# Patient Record
Sex: Male | Born: 1990 | Race: Black or African American | Hispanic: No | Marital: Single | Smoking: Current every day smoker
Health system: Southern US, Community
[De-identification: ages and names within clinical notes are randomized; demographics above are authoritative.]

## PROBLEM LIST (undated history)

## (undated) HISTORY — PX: APPENDECTOMY: SHX54

---
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
## 2008-12-10 ENCOUNTER — Observation Stay (HOSPITAL_COMMUNITY): Admission: EM | Admit: 2008-12-10 | Discharge: 2008-12-11 | Payer: Self-pay | Admitting: Emergency Medicine

## 2008-12-10 ENCOUNTER — Encounter (INDEPENDENT_AMBULATORY_CARE_PROVIDER_SITE_OTHER): Payer: Self-pay | Admitting: Surgery

## 2010-01-07 IMAGING — CT CT PELVIS W/ CM
2 of 4 series · 17 of 46 positions shown, 19 images · IV contrast (water- bmi proto & 100 ML OMNI 300)
Comparison: None available.

CT ABDOMEN

CLINICAL DATA: Mid epigastric pain.  Abdominal pain.

CT ABDOMEN AND PELVIS WITH CONTRAST
TECHNIQUE: Multidetector CT imaging of the abdomen and pelvis was
performed using the standard protocol following bolus
administration of intravenous contrast.
Contrast: 100 ml Zmnipaque-Z55.

[Series 2: routine abdomen · axial · 0.98mm/px · z∈[-502,-82]mm · 14 of 94 slices shown, 16 images]
[im 5/94  soft-tissue]
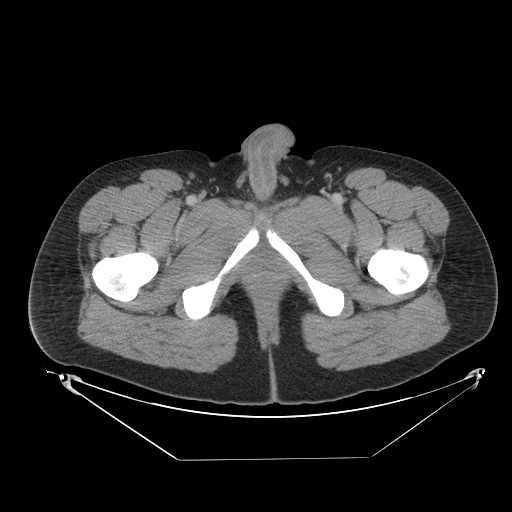
[im 5/94  bone]
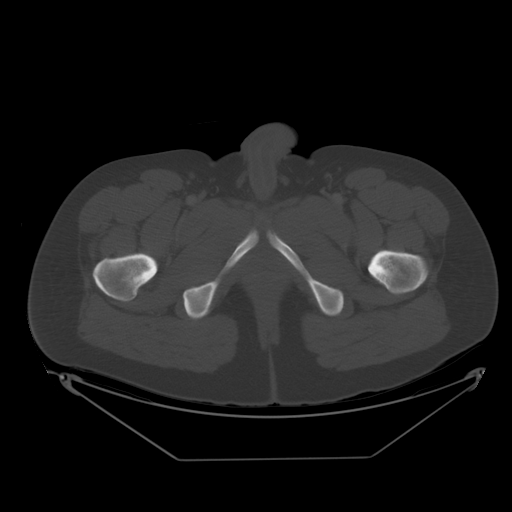
[im 13/94  soft-tissue]
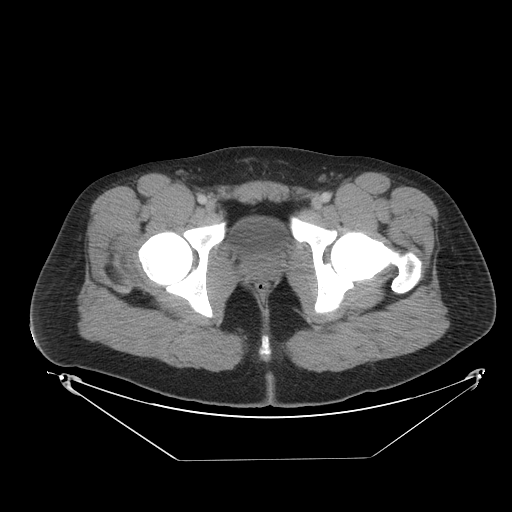
[im 17/94  soft-tissue]
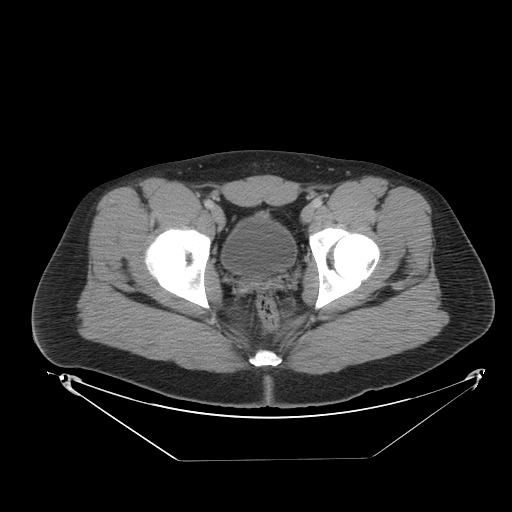
[im 25/94  soft-tissue]
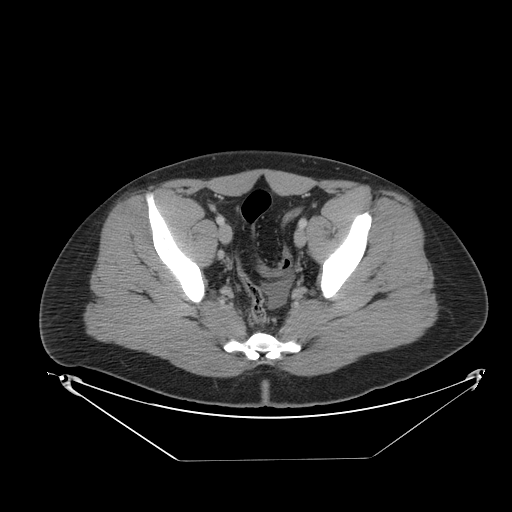
[im 33/94  soft-tissue]
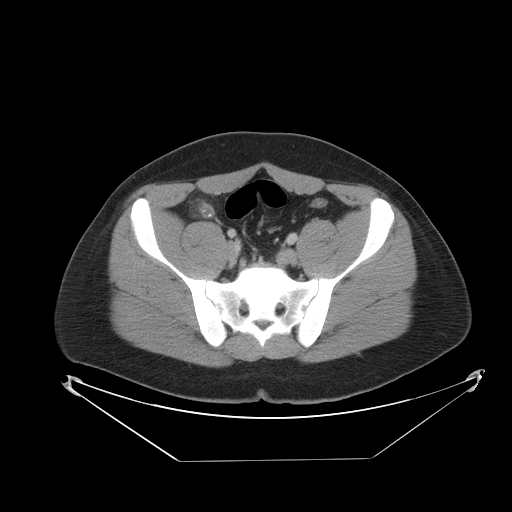
[im 37/94  soft-tissue]
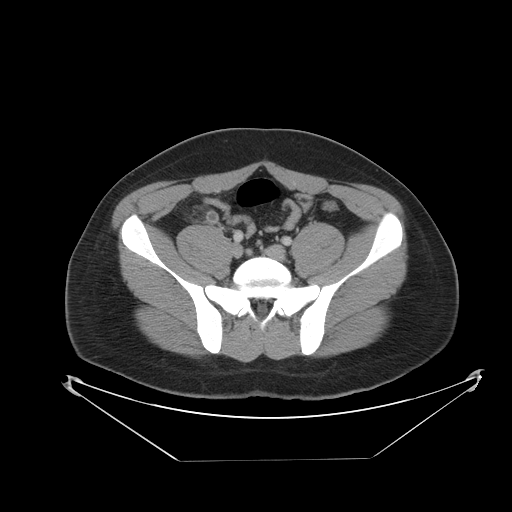
[im 45/94  soft-tissue]
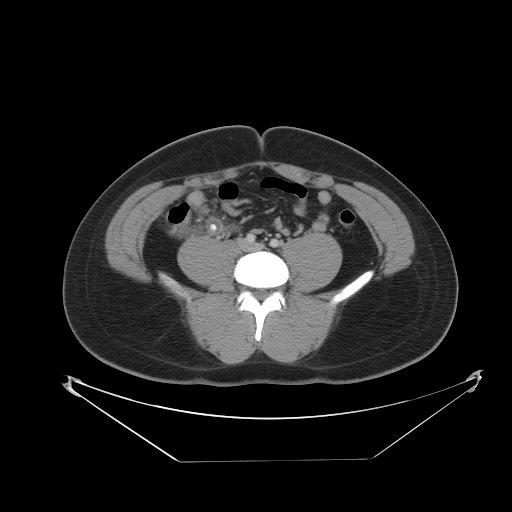
[im 49/94  soft-tissue]
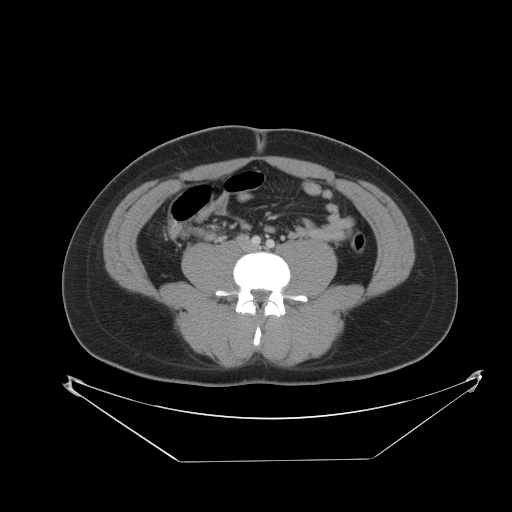
[im 57/94  soft-tissue]
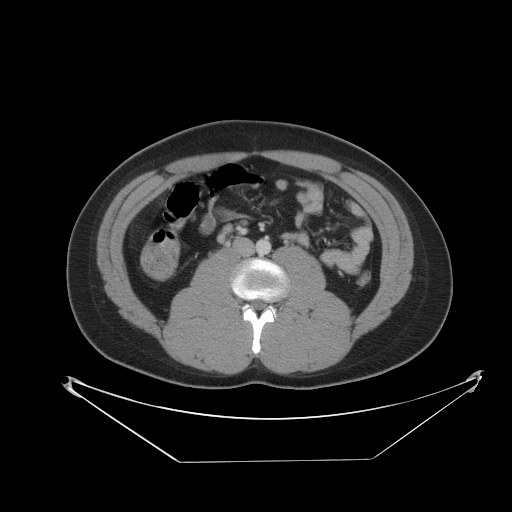
[im 57/94  bone]
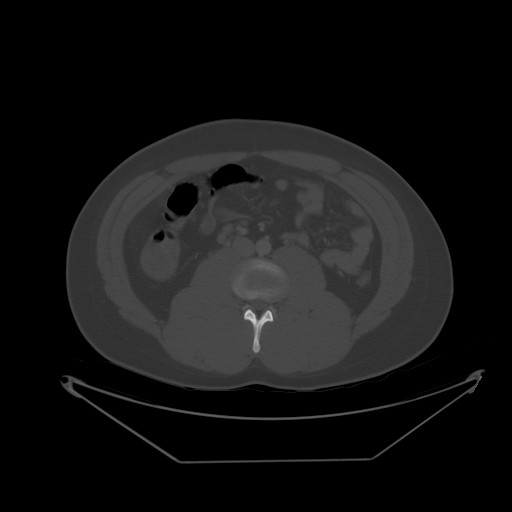
[im 61/94  soft-tissue]
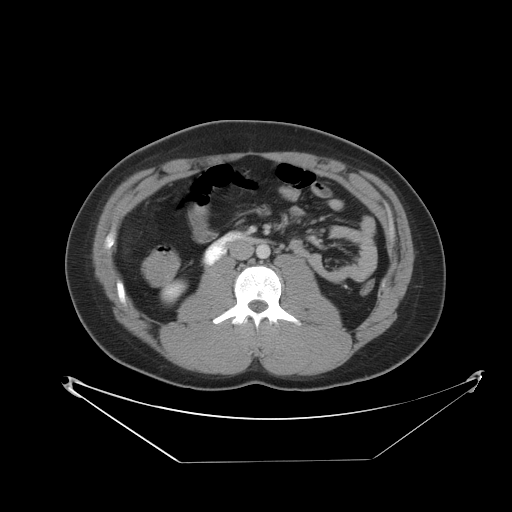
[im 69/94  soft-tissue]
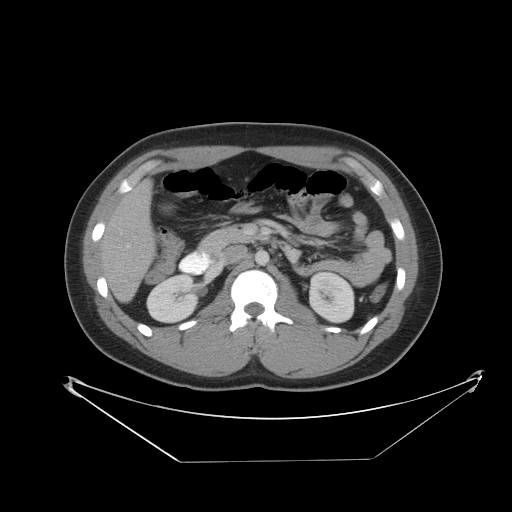
[im 77/94  soft-tissue]
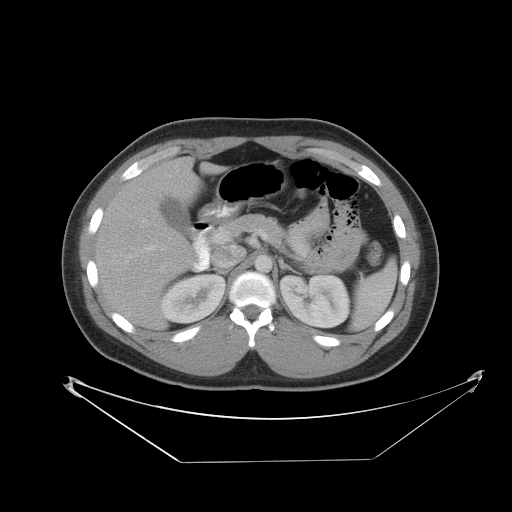
[im 81/94  soft-tissue]
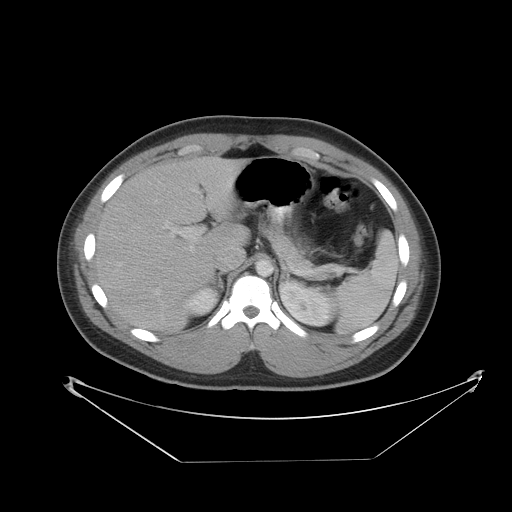
[im 89/94  soft-tissue]
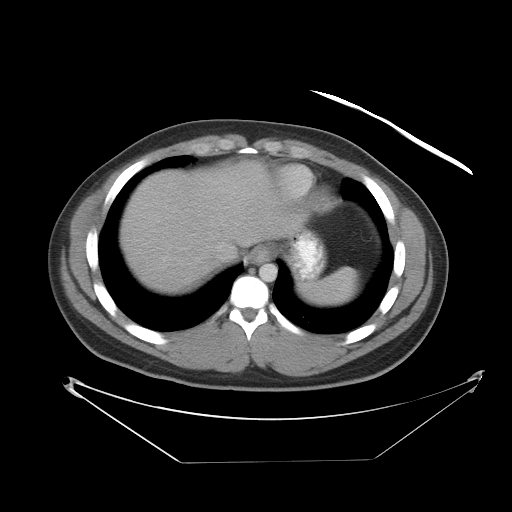

[Series 401: coronal · coronal · 0.90mm/px · 3 of 81 slices shown]
[im 27/81  soft-tissue]
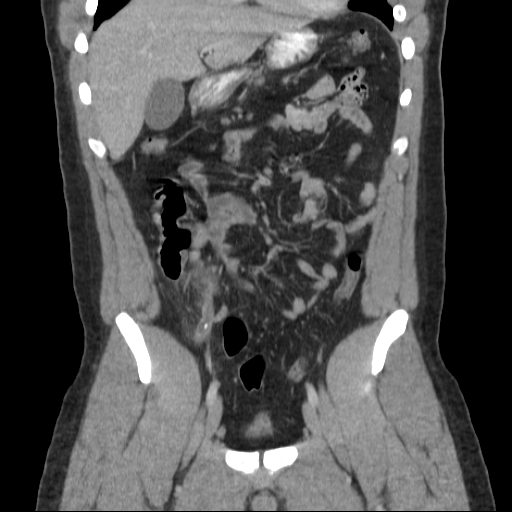
[im 36/81  soft-tissue]
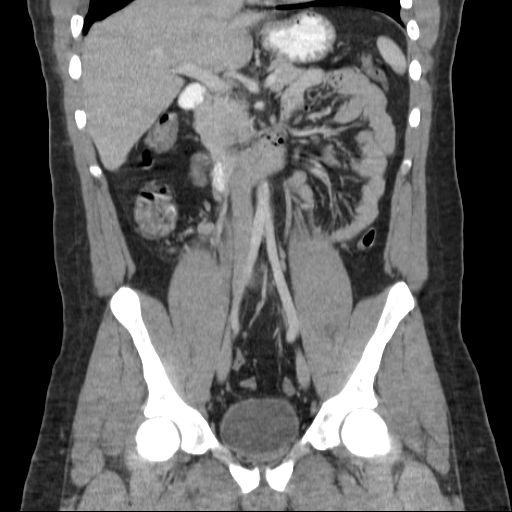
[im 45/81  soft-tissue]
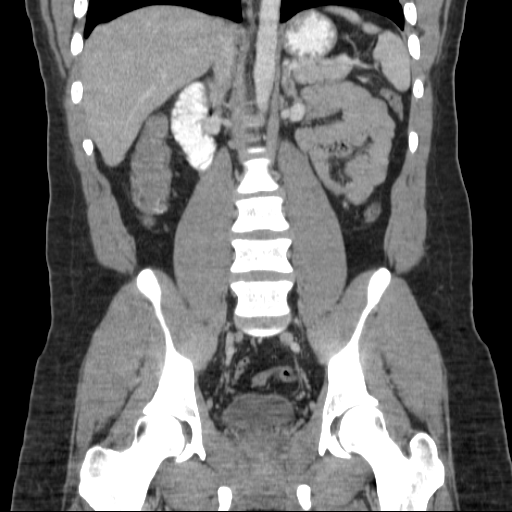

[17 of 46 positions shown; findings below may reference images not displayed]

FINDINGS: The lung bases are clear.  There is no pleural or
pericardial effusion.

The liver, gallbladder, biliary tree, adrenal glands, spleen,
kidneys and pancreas all appear normal.  No abdominal
lymphadenopathy or fluid.  There are no focal bony abnormality.
Stomach and small bowel appear normal.
IMPRESSION: Negative abdomen CT scan.

CT PELVIS
FINDINGS: There is dilatation of the appendix with periappendiceal
inflammatory change.  Three appendicoliths are identified with two
small calcifications seen in the distal appendix and a larger
calcification in the more proximal appendix measuring 0.8 cm in
diameter.  There is periappendiceal fluid is worrisome for
perforation without complete rupture. No abscess is identified.
There is a small amount of free pelvic fluid.  The colon appears
normal.  No lymphadenopathy.  Urinary bladder and seminal vesicles
appear normal.
IMPRESSION: Study is positive for acute appendicitis.  Periappendiceal fluid is
noted and there may be a tiny perforation of the appendix but no
evidence of full rupture is seen.

## 2010-05-25 ENCOUNTER — Emergency Department (HOSPITAL_COMMUNITY): Admission: EM | Admit: 2010-05-25 | Discharge: 2010-05-25 | Payer: Self-pay | Admitting: Emergency Medicine

## 2010-11-24 LAB — COMPREHENSIVE METABOLIC PANEL
ALT: 26 U/L (ref 0–53)
AST: 19 U/L (ref 0–37)
Albumin: 3.9 g/dL (ref 3.5–5.2)
Alkaline Phosphatase: 62 U/L (ref 39–117)
BUN: 17 mg/dL (ref 6–23)
Calcium: 9.6 mg/dL (ref 8.4–10.5)
GFR calc non Af Amer: >60 mL/min (ref >60)
Sodium: 137 mEq/L (ref 135–145)
Total Bilirubin: 0.7 mg/dL (ref 0.3–1.2)
Total Protein: 6.5 g/dL (ref 6.0–8.3)

## 2010-11-24 LAB — DIFFERENTIAL
Eosinophils Absolute: 0.7 10*3/uL (ref 0.0–0.7)
Eosinophils Relative: 5 % (ref 0–5)
Lymphocytes Relative: 11 % — ABNORMAL LOW (ref 12–46)
Lymphs Abs: 1.7 10*3/uL (ref 0.7–4.0)
Neutro Abs: 12 10*3/uL — ABNORMAL HIGH (ref 1.7–7.7)

## 2010-11-24 LAB — URINALYSIS, ROUTINE W REFLEX MICROSCOPIC
Bilirubin Urine: NEGATIVE
Hgb urine dipstick: NEGATIVE
Nitrite: NEGATIVE
Protein, ur: NEGATIVE mg/dL

## 2010-11-24 LAB — CBC
Hemoglobin: 15.2 g/dL (ref 13.0–17.0)
MCHC: 34.1 g/dL (ref 30.0–36.0)
RBC: 5.4 MIL/uL (ref 4.22–5.81)
RDW: 12.5 % (ref 11.5–15.5)
WBC: 15.4 10*3/uL — ABNORMAL HIGH (ref 4.0–10.5)

## 2010-11-24 LAB — LIPASE, BLOOD: Lipase: 19 U/L (ref 11–59)

## 2010-12-28 NOTE — Op Note (Signed)
NAME:  Tyrone Johns, Tyrone Johns NO.:  [id number]   MEDICAL RECORD NO.:  [id number]          PATIENT TYPE:  INP   LOCATION:  2550                         FACILITY:  [facility]   PHYSICIAN:  Wilmon Arms. Corliss Skains, M.D. DATE OF BIRTH:  1990/10/09   DATE OF PROCEDURE:  12/10/2008  DATE OF DISCHARGE:                               OPERATIVE REPORT   PREOPERATIVE DIAGNOSIS:  Acute appendicitis.   POSTOPERATIVE DIAGNOSIS:  Acute appendicitis.   PROCEDURE PERFORMED:  Laparoscopic appendectomy.   SURGEON:  Wilmon Arms. Corliss Skains, MD   ANESTHESIA:  General.   INDICATIONS:  The patient is a 20 year old male who presents with a 24-  hour history of worsening right lower quadrant pain.  He denies any  nausea, vomiting, or diarrhea.  He has had a poor appetite today.  He  presents to the emergency department for evaluation where a CT scan  showed an inflamed and enlarged appendix with some small amount of free  fluid but no sign of abscess.  His white count was elevated at 15.4.  We  are consulted for appendectomy.   DESCRIPTION OF PROCEDURE:  The patient was brought to the operating and  placed in supine position on the operating room table.  After an  adequate level of general anesthesia was obtained, a Foley catheter was  placed under sterile technique.  The patient's abdomen was prepped with  Betadine and draped in sterile fashion.  A time-out was taken to ensure  the proper patient and proper procedure.  We infiltrated the area above  the umbilicus with 0.25% Marcaine with epinephrine.  A transverse  incision was made and dissection was carried down through the  subcutaneous tissues to the fascia.  The fascia was opened vertically.  We entered the peritoneal cavity bluntly.  A stay suture of 0-Vicryl was  placed in the fascial opening.  We inserted the Hasson cannula and  secured it with a stay suture.  Pneumoperitoneum was obtained by  insufflating CO2 maintaining a maximal pressure of 15  mmHg.  Laparoscope  was inserted and the patient was positioned in Trendelenburg tilted to  his left.  A 5-mm port was placed in the right upper quadrant and  another 5-mm port in the left lower quadrant.  We moved the scope to the  right upper quadrant port site.  A very large inflamed appendix was  noted in the right lower quadrant.  We grasped this with a clamp and  elevated.  We used harmonic scalpel to lyse the adhesions around the  appendix.  The appendix turned out to be very long and was extremely  swollen and dilated.  There was no gross perforation or purulence.  We  continued tracing this back until we were able to identify the junction  of the appendix and the cecum.  The base of the appendix was soft and  normal in appearance.  Once we had completely taken down the  mesoappendix, the base of the appendix at the cecum was divided with  Endo-GIA stapler blue load.  The appendix was placed in an EndoCatch sac  and removed through the  umbilical port site.  The staple line was  carefully inspected and hemostasis was good.  We irrigated the right  lower quadrant and then removed our trocars as pneumoperitoneum was  released.  The purse-string suture was used to close the fascia.  A 4-0 Monocryl  was used to close the skin incisions.  Steri-Strips and clean dressings  were applied.  The patient was extubated and brought to recovery room in  stable condition.  The Foley catheter was removed.      Wilmon Arms. Tsuei, M.D.  Electronically Signed     MKT/MEDQ  D:  12/10/2008  T:  12/11/2008  Job:  [id number]

## 2014-03-06 ENCOUNTER — Emergency Department (HOSPITAL_COMMUNITY)
Admission: EM | Admit: 2014-03-06 | Discharge: 2014-03-06 | Disposition: A | Discharge status: Home / Self Care | Payer: BC Managed Care – PPO | Attending: Emergency Medicine | Admitting: Emergency Medicine

## 2014-03-06 ENCOUNTER — Encounter (HOSPITAL_COMMUNITY): Payer: Self-pay | Admitting: Emergency Medicine

## 2014-03-06 ENCOUNTER — Emergency Department (HOSPITAL_COMMUNITY): Payer: BC Managed Care – PPO

## 2014-03-06 DIAGNOSIS — Y92410 Unspecified street and highway as the place of occurrence of the external cause: Secondary | ICD-10-CM | POA: Insufficient documentation

## 2014-03-06 DIAGNOSIS — F17200 Nicotine dependence, unspecified, uncomplicated: Secondary | ICD-10-CM | POA: Insufficient documentation

## 2014-03-06 DIAGNOSIS — S43102A Unspecified dislocation of left acromioclavicular joint, initial encounter: Secondary | ICD-10-CM

## 2014-03-06 DIAGNOSIS — Y9389 Activity, other specified: Secondary | ICD-10-CM | POA: Insufficient documentation

## 2014-03-06 DIAGNOSIS — S43109A Unspecified dislocation of unspecified acromioclavicular joint, initial encounter: Secondary | ICD-10-CM | POA: Insufficient documentation

## 2014-03-06 DIAGNOSIS — S46909A Unspecified injury of unspecified muscle, fascia and tendon at shoulder and upper arm level, unspecified arm, initial encounter: Secondary | ICD-10-CM | POA: Insufficient documentation

## 2014-03-06 DIAGNOSIS — S4980XA Other specified injuries of shoulder and upper arm, unspecified arm, initial encounter: Secondary | ICD-10-CM | POA: Insufficient documentation

## 2014-03-06 MED ORDER — IBUPROFEN 400 MG PO TABS
800.0000 mg | ORAL_TABLET | Freq: Once | ORAL | Status: AC
  Administered 2014-03-06: 800 mg via ORAL
  Filled 2014-03-06: qty 2

## 2014-03-06 MED ORDER — HYDROCODONE-ACETAMINOPHEN 5-325 MG PO TABS: 1.0000 | ORAL_TABLET | ORAL | Status: AC | PRN

## 2014-03-06 MED ORDER — OXYCODONE-ACETAMINOPHEN 5-325 MG PO TABS
1.0000 | ORAL_TABLET | Freq: Once | ORAL | Status: AC
  Administered 2014-03-06: 1 via ORAL
  Filled 2014-03-06: qty 1
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
## 2014-03-06 NOTE — Discharge Planning (Signed)
[facility] Community Liaison ° °Spoke to the patient regarding primary care resources and establishing care with a provider. Resource guide and my contact information provided for any future questions or concerns. No other community liaison needs identified at this time. °

## 2014-03-06 NOTE — Discharge Instructions (Signed)
Take Vicodin for severe pain only. No driving or operating heavy machinery while taking vicodin. This medication may cause drowsiness. Apply ice intermittently throughout the day. Wear the sling unless you are at home. No lifting greater than 10 pounds until followup with orthopedics.  Acromioclavicular Injuries The AC (acromioclavicular) joint is the joint in the shoulder where the collarbone (clavicle) meets the shoulder blade (scapula). The part of the shoulder blade connected to the collarbone is called the acromion. Common problems with and treatments for the [facility] joint are detailed below. ARTHRITIS Arthritis occurs when the joint has been injured and the smooth padding between the joints (cartilage) is lost. This is the wear and tear seen in most joints of the body if they have been overused. This causes the joint to produce pain and swelling which is worse with activity.  AC JOINT SEPARATION AC joint separation means that the ligaments connecting the acromion of the shoulder blade and collarbone have been damaged, and the two bones no longer line up. AC separations can be anywhere from mild to severe, and are "graded" depending upon which ligaments are torn and how badly they are torn.  Grade I Injury: the least damage is done, and the [facility] joint still lines up.  Grade II Injury: damage to the ligaments which reinforce the [facility] joint. In a Grade II injury, these ligaments are stretched but not entirely torn. When stressed, the [facility] joint becomes painful and unstable.  Grade III Injury: AC and secondary ligaments are completely torn, and the collarbone is no longer attached to the shoulder blade. This results in deformity; a prominence of the end of the clavicle. AC JOINT FRACTURE AC joint fracture means that there has been a break in the bones of the [facility] joint, usually the end of the clavicle. TREATMENT TREATMENT OF AC ARTHRITIS  There is currently no way to replace the cartilage damaged by  arthritis. The best way to improve the condition is to decrease the activities which aggravate the problem. Application of ice to the joint helps decrease pain and soreness (inflammation). The use of non-steroidal anti-inflammatory medication is helpful.  If less conservative measures do not work, then cortisone shots (injections) may be used. These are anti-inflammatories; they decrease the soreness in the joint and swelling.  If non-surgical measures fail, surgery may be recommended. The procedure is generally removal of a portion of the end of the clavicle. This is the part of the collarbone closest to your acromion which is stabilized with ligaments to the acromion of the shoulder blade. This surgery may be performed using a tube-like instrument with a light (arthroscope) for looking into a joint. It may also be performed as an open surgery through a small incision by the surgeon. Most patients will have good range of motion within 6 weeks and may return to all activity including sports by 8-12 weeks, barring complications. TREATMENT OF AN AC SEPARATION  The initial treatment is to decrease pain. This is best accomplished by immobilizing the arm in a sling and placing an ice pack to the shoulder for 20 to 30 minutes every 2 hours as needed. As the pain starts to subside, it is important to begin moving the fingers, wrist, elbow and eventually the shoulder in order to prevent a stiff or "frozen" shoulder. Instruction on when and how much to move the shoulder will be provided by your caregiver. The length of time needed to regain full motion and function depends on the amount or grade of  the injury. Recovery from a Grade I AC separation usually takes 10 to 14 days, whereas a Grade III may take 6 to 8 weeks.  Grade I and II separations usually do not require surgery. Even Grade III injuries usually allow return to full activity with few restrictions. Treatment is also based on the activity demands of the  injured shoulder. For example, a high level quarterback with an injured throwing arm will receive more aggressive treatment than someone with a desk job who rarely uses his/her arm for strenuous activities. In some cases, a painful lump may persist which could require a later surgery. Surgery can be very successful, but the benefits must be weighed against the potential risks. TREATMENT OF AN AC JOINT FRACTURE Fracture treatment depends on the type of fracture. Sometimes a splint or sling may be all that is required. Other times surgery may be required for repair. This is more frequently the case when the ligaments supporting the clavicle are completely torn. Your caregiver will help you with these decisions and together you can decide what will be the best treatment. HOME CARE INSTRUCTIONS   Apply ice to the injury for 15-20 minutes each hour while awake for 2 days. Put the ice in a plastic bag and place a towel between the bag of ice and skin.  If a sling has been applied, wear it constantly for as long as directed by your caregiver, even at night. The sling or splint can be removed for bathing or showering or as directed. Be sure to keep the shoulder in the same place as when the sling is on. Do not lift the arm.  If a figure-of-eight splint has been applied it should be tightened gently by another person every day. Tighten it enough to keep the shoulders held back. Allow enough room to place the index finger between the body and strap. Loosen the splint immediately if there is numbness or tingling in the hands.  Take over-the-counter or prescription medicines for pain, discomfort or fever as directed by your caregiver.  If you or your child has received a follow up appointment, it is very important to keep that appointment in order to avoid long term complications, chronic pain or disability. SEEK MEDICAL CARE IF:   The pain is not relieved with medications.  There is increased swelling or  discoloration that continues to get worse rather than better.  You or your child has been unable to follow up as instructed.  There is progressive numbness and tingling in the arm, forearm or hand. SEEK IMMEDIATE MEDICAL CARE IF:   The arm is numb, cold or pale.  There is increasing pain in the hand, forearm or fingers. MAKE SURE YOU:   Understand these instructions.  Will watch your condition.  Will get help right away if you are not doing well or get worse. Document Released: 05/11/2005 Document Revised: 10/24/2011 Document Reviewed: 11/03/2008 [facility] Patient Information 2015 [place], [place]. This information is not intended to replace advice given to you by your health care provider. Make sure you discuss any questions you have with your health care provider.  Acromioclavicular Separation with Rehab The acromioclavicular joint is the joint between the roof of the shoulder (acromion) and the collarbone (clavicle). It is vulnerable to injury. An acromioclavicular [facility]) separation is a partial or complete tear (sprain), injury, or redness and soreness (inflammation) of the ligaments that cross the acromioclavicular joint and hold it in place. There are two ligaments in this area that are  vulnerable to injury, the acromioclavicular ligament and the coracoclavicular ligament. SYMPTOMS   Tenderness and swelling, or a bump on top of the shoulder (at the [facility] joint).  Bruising (contusion) in the area within 48 hours of injury.  Loss of strength or pain when reaching over the head or across the body. CAUSES  AC separation is caused by direct trauma to the joint (falling on your shoulder) or indirect trauma (falling on an outstretched arm). RISK INCREASES WITH:  Sports that require contact or collision, throwing sports (i.e. racquetball, squash).  Poor strength and flexibility.  Previous shoulder sprain or dislocation.  Poorly fitted or padded protective equipment. PREVENTION    Warm-up and stretch properly before activity.  Maintain physical fitness:  Shoulder strength.  Shoulder flexibility.  Cardiovascular fitness.  Wear properly fitted and padded protective equipment.  Learn and use proper technique when playing sports. Have a coach correct improper technique, including falling and landing.  Apply taping, protective strapping or padding, or an adhesive bandage as recommended before practice or competition. PROGNOSIS   If treated properly, the symptoms of AC separation can be expected to go away.  If treated improperly, permanent disability may occur unless surgery is performed.  Healing time varies with type of sport and position, arm injured (dominant versus non-dominant) and severity of sprain. RELATED COMPLICATIONS  Weakness and fatigue of the arm or shoulder are possible but uncommon.  Pain and inflammation of the [facility] joint may continue.  Prolonged healing time may be necessary if usual activities are resumed too early. This causes a susceptibility to recurrent injury.  Prolonged disability may occur.  The shoulder may remain unstable or arthritic following repeated injury. TREATMENT  Treatment initially involves ice and medication to help reduce pain and inflammation. It may also be necessary to modify your activities in order to prevent further injury. Both non-surgical and surgical interventions exist to treat AC separation. Non-surgical intervention is usually recommended and involves wearing a sling to immobilize the joint for a period of time to allow for healing. Surgical intervention is usually only considered for severe sprains of the ligament or for individuals who do not improve after 2 to 6 months of non-surgical treatment. Surgical interventions require 4 to 6 months before a return to sports is possible. MEDICATION  If pain medication is necessary, nonsteroidal anti-inflammatory medications, such as aspirin and ibuprofen, or  other minor pain relievers, such as acetaminophen, are often recommended.  Do not take pain medication for 7 days before surgery.  Prescription pain relievers may be given by your caregiver. Use only as directed and only as much as you need.  Ointments applied to the skin may be helpful.  Corticosteroid injections may be given to reduce inflammation. HEAT AND COLD  Cold treatment (icing) relieves pain and reduces inflammation. Cold treatment should be applied for 10 to 15 minutes every 2 to 3 hours for inflammation and pain and immediately after any activity that aggravates your symptoms. Use ice packs or an ice massage.  Heat treatment may be used prior to performing the stretching and strengthening activities prescribed by your caregiver, physical therapist or athletic trainer. Use a heat pack or a warm soak. SEEK IMMEDIATE MEDICAL CARE IF:   Pain, swelling or bruising worsens despite treatment.  There is pain, numbness or coldness in the arm.  Discoloration appears in the fingernails.  New, unexplained symptoms develop. EXERCISES  RANGE OF MOTION (ROM) AND STRETCHING EXERCISES - Acromioclavicular Separation These exercises may help you when beginning  to rehabilitate your injury. Your symptoms may resolve with or without further involvement from your physician, physical therapist or athletic trainer. While completing these exercises, remember:  Restoring tissue flexibility helps normal motion to return to the joints. This allows healthier, less painful movement and activity.  An effective stretch should be held for at least 30 seconds.  A stretch should never be painful. You should only feel a gentle lengthening or release in the stretched tissue. ROM - Pendulum  Bend at the waist so that your right / left arm falls away from your body. Support yourself with your opposite hand on a solid surface, such as a table or a countertop.  Your right / left arm should be perpendicular to  the ground. If it is not perpendicular, you need to lean over farther. Relax the muscles in your right / left arm and shoulder as much as possible.  Gently sway your hips and trunk so they move your right / left arm without any use of your right / left shoulder muscles.  Progress your movements so that your right / left arm moves side to side, then forward and backward, and finally, both clockwise and counterclockwise.  Complete __________ repetitions in each direction. Many people use this exercise to relieve discomfort in their shoulder as well as to gain range of motion. Repeat __________ times. Complete this exercise __________ times per day. STRETCH - Flexion, Seated   Sit in a firm chair so that your right / left forearm can rest on a table or countertop. Your right / left elbow should rest below the height of your shoulder so that your shoulder feels supported and not tense or uncomfortable.  Keeping your right / left shoulder relaxed, lean forward at your waist, allowing your right / left hand to slide forward. Bend forward until you feel a moderate stretch in your shoulder, but before you feel an increase in your pain.  Hold __________ seconds. Slowly return to your starting position. Repeat __________ times. Complete this exercise __________ times per day. STRETCH - Flexion, Standing  Stand with good posture. With an underhand grip on your right / left and an overhand grip on the opposite hand, grasp a broomstick or cane so that your hands are a little more than shoulder-width apart.  Keeping your right / left elbow straight and shoulder muscles relaxed, push the stick with your opposite hand to raise your right / left arm in front of your body and then overhead. Raise your arm until you feel a stretch in your right / left shoulder, but before you have increased shoulder pain.  Try to avoid shrugging your right / left shoulder as your arm rises by keeping your shoulder blade tucked  down and toward your mid-back spine. Hold __________ seconds.  Slowly return to the starting position. Repeat __________ times. Complete this exercise __________ times per day. STRENGTHENING EXERCISES - Acromioclavicular Separation These exercises may help you when beginning to rehabilitate your injury. They may resolve your symptoms with or without further involvement from your physician, physical therapist or athletic trainer. While completing these exercises, remember:  Muscles can gain both the endurance and the strength needed for everyday activities through controlled exercises.  Complete these exercises as instructed by your physician, physical therapist or athletic trainer. Progress the resistance and repetitions only as guided.  You may experience muscle soreness or fatigue, but the pain or discomfort you are trying to eliminate should never worsen during these exercises. If this pain  does worsen, stop and make certain you are following the directions exactly. If the pain is still present after adjustments, discontinue the exercise until you can discuss the trouble with your clinician. STRENGTH - Shoulder Abductors, Isometric   With good posture, stand or sit about 4-6 inches from a wall with your right / left side facing the wall.  Bend your right / left elbow. Gently press your right / left elbow into the wall. Increase the pressure gradually until you are pressing as hard as you can without shrugging your shoulder or increasing any shoulder discomfort.  Hold __________ seconds.  Release the tension slowly. Relax your shoulder muscles completely before you start the next repetition. Repeat __________ times. Complete this exercise __________ times per day. STRENGTH - Internal Rotators, Isometric  Keep your right / left elbow at your side and bend it 90 degrees.  Step into a door frame so that the inside of your right / left wrist can press against the door frame without your upper  arm leaving your side.  Gently press your right / left wrist into the door frame as if you were trying to draw the palm of your hand to your abdomen. Gradually increase the tension until you are pressing as hard as you can without shrugging your shoulder or increasing any shoulder discomfort.  Hold __________ seconds.  Release the tension slowly. Relax your shoulder muscles completely before you the next repetition. Repeat __________ times. Complete this exercise __________ times per day.  STRENGTH - External Rotators, Isometric  Keep your right / left elbow at your side and bend it 90 degrees.  Step into a door frame so that the outside of your right / left wrist can press against the door frame without your upper arm leaving your side.  Gently press your right / left wrist into the door frame as if you were trying to swing the back of your hand away from your abdomen. Gradually increase the tension until you are pressing as hard as you can without shrugging your shoulder or increasing any shoulder discomfort.  Hold __________ seconds.  Release the tension slowly. Relax your shoulder muscles completely before you the next repetition. Repeat __________ times. Complete this exercise __________ times per day. STRENGTH - Internal Rotators  Secure a rubber exercise band/tubing to a fixed object so that it is at the same height as your right / left elbow when you are standing or sitting on a firm surface.  Stand or sit so that the secured exercise band/tubing is at your right / left side.  Bend your elbow 90 degrees. Place a folded towel or small pillow under your right / left arm so that your elbow is a few inches away from your side.  Keeping the tension on the exercise band/tubing, pull it across your body toward your abdomen. Be sure to keep your body steady so that the movement is only coming from your shoulder rotating.  Hold __________ seconds. Release the tension in a controlled  manner as you return to the starting position. Repeat __________ times. Complete this exercise __________ times per day. STRENGTH - External Rotators  Secure a rubber exercise band/tubing to a fixed object so that it is at the same height as your right / left elbow when you are standing or sitting on a firm surface.  Stand or sit so that the secured exercise band/tubing is at your side that is not injured.  Bend your elbow 90 degrees. Place a folded  towel or small pillow under your right / left arm so that your elbow is a few inches away from your side.  Keeping the tension on the exercise band/tubing, pull it away from your body, as if pivoting on your elbow. Be sure to keep your body steady so that the movement is only coming from your shoulder rotating.  Hold __________ seconds. Release the tension in a controlled manner as you return to the starting position. Repeat __________ times. Complete this exercise __________ times per day. Document Released: 08/01/2005 Document Revised: 10/24/2011 Document Reviewed: 11/13/2008 [facility] Patient Information 2015 [place], [place]. This information is not intended to replace advice given to you by your health care provider. Make sure you discuss any questions you have with your health care provider.
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
## 2014-03-07 NOTE — ED Provider Notes (Signed)
Medical screening examination/treatment/procedure(s) were performed by non-physician practitioner and as supervising physician I was immediately available for consultation/collaboration.     Geoffery Lyonsouglas Princeston Blizzard, MD 03/07/14 [phone]

## 2014-04-25 ENCOUNTER — Encounter (HOSPITAL_COMMUNITY): Payer: Self-pay | Admitting: Emergency Medicine

## 2014-04-25 ENCOUNTER — Emergency Department (INDEPENDENT_AMBULATORY_CARE_PROVIDER_SITE_OTHER)
Admission: EM | Admit: 2014-04-25 | Discharge: 2014-04-25 | Disposition: A | Discharge status: Home / Self Care | Payer: BC Managed Care – PPO | Source: Home / Self Care | Attending: Emergency Medicine | Admitting: Emergency Medicine

## 2014-04-25 DIAGNOSIS — K089 Disorder of teeth and supporting structures, unspecified: Secondary | ICD-10-CM

## 2014-04-25 DIAGNOSIS — K0889 Other specified disorders of teeth and supporting structures: Secondary | ICD-10-CM

## 2014-04-25 DIAGNOSIS — K05219 Aggressive periodontitis, localized, unspecified severity: Secondary | ICD-10-CM

## 2014-04-25 DIAGNOSIS — K0520 Aggressive periodontitis, unspecified: Secondary | ICD-10-CM

## 2014-04-25 MED ORDER — HYDROCODONE-ACETAMINOPHEN 5-325 MG PO TABS: 2.0000 | ORAL_TABLET | ORAL | Status: AC | PRN

## 2014-04-25 MED ORDER — IBUPROFEN 800 MG PO TABS: 800.0000 mg | ORAL_TABLET | Freq: Three times a day (TID) | ORAL | Status: AC

## 2014-04-25 MED ORDER — AMOXICILLIN 500 MG PO CAPS: 500.0000 mg | ORAL_CAPSULE | Freq: Two times a day (BID) | ORAL | Status: AC

## 2014-04-25 NOTE — ED Provider Notes (Signed)
Medical screening examination/treatment/procedure(s) were performed by non-physician practitioner and as supervising physician I was immediately available for consultation/collaboration.  Leslee Home, M.D.  Reuben Likes, MD 04/25/14 2135

## 2014-04-25 NOTE — ED Provider Notes (Signed)
CSN: [id number]     Arrival date & time 04/25/14  1848 History   First MD Initiated Contact with Patient 04/25/14 1903     Chief Complaint  Patient presents with  . Dental Pain   (Consider location/radiation/quality/duration/timing/severity/associated sxs/prior Treatment) HPI Comments: Patient presents with a painful lower left sided tooth. He reports a "hole" in the tooth, with pain that began 2 days ago. He has a dentist appt on Tuesday but the pain is worsening. No fever or chills are noted. No N, V   Patient is a 23 y.o. male presenting with tooth pain. The history is provided by the patient.  Dental Pain   History reviewed. No pertinent past medical history. Past Surgical History  Procedure Laterality Date  . Appendectomy     No family history on file. History  Substance Use Topics  . Smoking status: Current Every Day Smoker  . Smokeless tobacco: Not on file  . Alcohol Use: Yes    Review of Systems  All other systems reviewed and are negative.   Allergies  Review of patient's allergies indicates no known allergies.  Home Medications   Prior to Admission medications   Medication Sig Start Date End Date Taking? Authorizing Provider  amoxicillin (AMOXIL) 500 MG capsule Take 1 capsule (500 mg total) by mouth 2 (two) times daily. 04/25/14 05/02/14  Riki Sheer, PA-C  HYDROcodone-acetaminophen (NORCO/VICODIN) 5-325 MG per tablet Take 1-2 tablets by mouth every 4 (four) hours as needed. 03/06/14   Trevor Mace, PA-C  HYDROcodone-acetaminophen (NORCO/VICODIN) 5-325 MG per tablet Take 2 tablets by mouth every 4 (four) hours as needed. 04/25/14   Riki Sheer, PA-C  ibuprofen (ADVIL,MOTRIN) 800 MG tablet Take 1 tablet (800 mg total) by mouth 3 (three) times daily. 04/25/14   Riki Sheer, PA-C   BP 117/71  Pulse 54  Temp(Src) 98.6 F (37 C) (Oral)  Resp 16  SpO2 98% Physical Exam  Nursing note and vitals reviewed. Constitutional: He is oriented to person,  place, and time. He appears well-developed and well-nourished. No distress.  HENT:  Head: Normocephalic and atraumatic.  Mouth/Throat: No oropharyngeal exudate.  Left lower molar, cavitation in the tooth, with erythematous and swollen gum margin. Painful to palpation. Probable abscess  Pulmonary/Chest: Effort normal.  Lymphadenopathy:    He has no cervical adenopathy.  Neurological: He is alert and oriented to person, place, and time.  Skin: Skin is warm and dry. He is not diaphoretic.  Psychiatric: His behavior is normal.    ED Course  Procedures (including critical care time) Labs Review Labs Reviewed - No data to display  Imaging Review No results found.   MDM   1. Tooth pain   2. Gum abscess    Cover with antibiotics. NSAIDs and a few Hydrocodone. F/U with Dentist is imperative. If worsens or fevers f/u sooner.     Riki Sheer, PA-C 04/25/14 1941

## 2014-04-25 NOTE — Discharge Instructions (Signed)
Abscessed Tooth An abscessed tooth is an infection around your tooth. It may be caused by holes or damage to the tooth (cavity) or a dental disease. An abscessed tooth causes mild to very bad pain in and around the tooth. See your dentist right away if you have tooth or gum pain. HOME CARE  Take your medicine as told. Finish it even if you start to feel better.  Do not drive after taking pain medicine.  Rinse your mouth (gargle) often with salt water ( teaspoon salt in 8 ounces of warm water).  Do not apply heat to the outside of your face. GET HELP RIGHT AWAY IF:   You have a temperature by mouth above 102 F (38.9 C), not controlled by medicine.  You have chills and a very bad headache.  You have problems breathing or swallowing.  Your mouth will not open.  You develop puffiness (swelling) on the neck or around the eye.  Your pain is not helped by medicine.  Your pain is getting worse instead of better. MAKE SURE YOU:   Understand these instructions.  Will watch your condition.  Will get help right away if you are not doing well or get worse. Document Released: 01/18/2008 Document Revised: 10/24/2011 Document Reviewed: 11/09/2010 Exit[employer] Patient Information 2015 [employer]. This information is not intended to replace advice given to you by your health care provider. Make sure you discuss any questions you have with your health care provider.  

## 2014-04-25 NOTE — ED Notes (Signed)
Patient c/o lower left molar dental pain. This is an ongoing problem. Patient reports he does have a dental appt coming up soon. Patient is alert and oriented and in NAD.

## 2014-12-16 ENCOUNTER — Encounter (HOSPITAL_COMMUNITY): Payer: Self-pay | Admitting: Family Medicine

## 2014-12-16 ENCOUNTER — Emergency Department (HOSPITAL_COMMUNITY): Payer: BLUE CROSS/BLUE SHIELD

## 2014-12-16 ENCOUNTER — Emergency Department (HOSPITAL_COMMUNITY)
Admission: EM | Admit: 2014-12-16 | Discharge: 2014-12-17 | Disposition: A | Discharge status: Home / Self Care | Payer: BLUE CROSS/BLUE SHIELD | Attending: Emergency Medicine | Admitting: Emergency Medicine

## 2014-12-16 DIAGNOSIS — Z791 Long term (current) use of non-steroidal anti-inflammatories (NSAID): Secondary | ICD-10-CM | POA: Insufficient documentation

## 2014-12-16 DIAGNOSIS — R002 Palpitations: Secondary | ICD-10-CM | POA: Insufficient documentation

## 2014-12-16 DIAGNOSIS — R000 Tachycardia, unspecified: Secondary | ICD-10-CM | POA: Diagnosis present

## 2014-12-16 DIAGNOSIS — R079 Chest pain, unspecified: Secondary | ICD-10-CM

## 2014-12-16 DIAGNOSIS — Z720 Tobacco use: Secondary | ICD-10-CM | POA: Diagnosis not present

## 2014-12-16 DIAGNOSIS — M791 Myalgia: Secondary | ICD-10-CM | POA: Insufficient documentation

## 2014-12-16 LAB — CBC
HEMATOCRIT: 43.2 % (ref 39.0–52.0)
HEMOGLOBIN: 14.2 g/dL (ref 13.0–17.0)
MCH: 27.7 pg (ref 26.0–34.0)
MCHC: 32.9 g/dL (ref 30.0–36.0)
MCV: 84.2 fL (ref 78.0–100.0)
Platelets: 225 10*3/uL (ref 150–400)
RBC: 5.13 MIL/uL (ref 4.22–5.81)
RDW: 12.5 % (ref 11.5–15.5)
WBC: 9.9 10*3/uL (ref 4.0–10.5)

## 2014-12-16 LAB — I-STAT TROPONIN, ED: Troponin i, poc: 0 ng/mL (ref 0.00–0.08)

## 2014-12-16 LAB — BASIC METABOLIC PANEL
ANION GAP: 11 (ref 5–15)
BUN: 16 mg/dL (ref 6–20)
CALCIUM: 9.4 mg/dL (ref 8.9–10.3)
CHLORIDE: 105 mmol/L (ref 101–111)
CO2: 23 mmol/L (ref 22–32)
Creatinine, Ser: 1.32 mg/dL — ABNORMAL HIGH (ref 0.61–1.24)
GFR calc Af Amer: >60 mL/min (ref >60)
GFR calc non Af Amer: >60 mL/min (ref >60)
GLUCOSE: 118 mg/dL — AB (ref 70–99)
Potassium: 3.7 mmol/L (ref 3.5–5.1)
SODIUM: 139 mmol/L (ref 135–145)

## 2014-12-16 LAB — BRAIN NATRIURETIC PEPTIDE: B NATRIURETIC PEPTIDE 5: 7.7 pg/mL (ref 0.0–100.0)

## 2014-12-16 NOTE — ED Notes (Signed)
Pt here for tachycardia. sts has been going on for a while. sts he was at work and not doing much and heart was racing and he was SOB. sts uncontrollable burping. 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

## 2014-12-17 NOTE — ED Provider Notes (Signed)
CSN: [id number]     Arrival date & time 12/16/14  2223 History  This chart was scribed for Tyrone Johns Tyrone Erhard, MD by Bronson CurbJacqueline Melvin, ED Scribe. This patient was seen in room D33C/D33C and the patient's care was started at 12:15 AM.   Chief Complaint  Patient presents with  . Tachycardia    Patient is a 24 y.o. male presenting with palpitations. The history is provided by the patient. No language interpreter was used.  Palpitations Palpitations quality:  Fast Onset quality:  At rest Duration:  15 minutes Timing:  Intermittent Progression:  Resolved Chronicity:  Recurrent Relieved by:  None tried Ineffective treatments:  None tried Associated symptoms: no cough, no nausea, no shortness of breath, no syncope and no vomiting   Risk factors: no diabetes mellitus, no heart disease, no hx of atrial fibrillation, no hx of DVT and no hx of PE      HPI Comments: [name] is a 24 y.o. male, with no significant past medical history, who presents to the Emergency Department complaining of palpitations that began 6 hours ago. Patient reports this current episode occurred while at rest and last approximately 10-15 minutes, which prompted him to come to th ED. Patient denies any symptoms at this time. Patient also mentions a burping sensation and pressure in the epigastric region that occurs when smoking cigarettes that has been ongoing for the past 1-2 years. Patient works at [employer] and reports pain in left chest and rib area with bending over and movement. He denies SOB, dyspnea on exertion, pain with deep breathing, recent long distance travel or surgeries. Patient has no cardiac history or history of PE/DVT. He further denies fevers, nausea, vomiting, cough, or hemoptysis. No familial h/o cardiac disease  PMH - none Past Surgical History  Procedure Laterality Date  . Appendectomy     History reviewed. No pertinent family history. History  Substance Use Topics  . Smoking status: Current  Every Day Smoker  . Smokeless tobacco: Not on file  . Alcohol Use: Yes    Review of Systems  Constitutional: Negative for fever.  Respiratory: Negative for cough and shortness of breath.   Cardiovascular: Positive for palpitations. Negative for syncope.  Gastrointestinal: Negative for nausea, vomiting and abdominal pain.  Musculoskeletal: Positive for myalgias.  All other systems reviewed and are negative.     Allergies  Review of patient's allergies indicates no known allergies.  Home Medications   Prior to Admission medications   Medication Sig Start Date End Date Taking? Authorizing Provider  HYDROcodone-acetaminophen (NORCO/VICODIN) 5-325 MG per tablet Take 1-2 tablets by mouth every 4 (four) hours as needed. 03/06/14   Kathrynn Speedobyn M Hess, PA-C  HYDROcodone-acetaminophen (NORCO/VICODIN) 5-325 MG per tablet Take 2 tablets by mouth every 4 (four) hours as needed. 04/25/14   Riki SheerMichelle G Young, PA-C  ibuprofen (ADVIL,MOTRIN) 800 MG tablet Take 1 tablet (800 mg total) by mouth 3 (three) times daily. 04/25/14   Riki SheerMichelle G Young, PA-C   Triage Vitals: BP 139/74 mmHg  Pulse 107  Temp(Src) 99.2 F (37.3 C)  Resp 16  Ht 6' (1.829 m)  Wt 235 lb (106.595 kg)  BMI 31.86 kg/m2  SpO2 96%  Physical Exam CONSTITUTIONAL: Well developed/well nourished HEAD: Normocephalic/atraumatic EYES: EOMI/PERRL ENMT: Mucous membranes moist NECK: supple no meningeal signs SPINE/BACK:entire spine nontender CV: S1/S2 noted, no murmurs/rubs/gallops noted LUNGS: Lungs are clear to auscultation bilaterally, no apparent distress ABDOMEN: soft, nontender, no rebound or guarding, bowel sounds noted throughout abdomen GU:no cva tenderness  NEURO: Pt is awake/alert/appropriate, moves all extremitiesx4.  No facial droop.   EXTREMITIES: pulses normal/equal, full ROM, no calf tenderness noted SKIN: warm, color normal PSYCH: no abnormalities of mood noted, alert and oriented to situation  ED Course  Procedures    DIAGNOSTIC STUDIES: Oxygen Saturation is 96% on room air, adequate by my interpretation.    COORDINATION OF CARE: At 0027 Discussed treatment plan with patient. Patient agrees.   Pt reports these symptoms ongoing for several years Some of his symptoms occur with smoking - he was counseled to stop smoking His vitals are unremarkable at this time, no hypoxia noted to suggest PE His EKG does show right ventricular conduction delay, likely chronic Advised f/u with PCP and cardiology, referrals given He may need holter monitor or possible echo in the future  Labs Review Labs Reviewed  BASIC METABOLIC PANEL - Abnormal; Notable for the following:    Glucose, Bld 118 (*)    Creatinine, Ser 1.32 (*)    All other components within normal limits  CBC  BRAIN NATRIURETIC PEPTIDE  I-STAT TROPOININ, ED    Imaging Review Dg Chest 2 View  12/16/2014   CLINICAL DATA:  Palpitations and dyspnea  EXAM: CHEST  2 VIEW  COMPARISON:  None.  FINDINGS: The heart size and mediastinal contours are within normal limits. Both lungs are clear. The visualized skeletal structures are unremarkable.  IMPRESSION: No active cardiopulmonary disease.   Electronically Signed   By: Ellery Plunkaniel R Mitchell M.D.   On: 12/16/2014 23:16     EKG Interpretation   Date/Time:  Tuesday Dec 16 2014 22:29:15 EDT Ventricular Rate:  110 PR Interval:  180 QRS Duration: 96 QT Interval:  326 QTC Calculation: 441 R Axis:   86 Text Interpretation:  Sinus tachycardia Biatrial enlargement RSR' or QR  pattern in V1 suggests right ventricular conduction delay Anteroseptal  infarct , age undetermined Abnormal ECG No previous ECGs available  Confirmed by Bebe ShaggyWICKLINE  MD, Jeziel Hoffmann ([id number]) on 12/17/2014 12:14:55 AM      MDM   Final diagnoses:  Palpitations    Nursing notes including past medical history and social history reviewed and considered in documentation xrays/imaging reviewed by myself and considered during evaluation Labs/vital  reviewed myself and considered during evaluation    I personally performed the services described in this documentation, which was scribed in my presence. The recorded information has been reviewed and is accurate.      Tyrone Johns Dilynn Munroe, MD 12/17/14 [phone]

## 2014-12-17 NOTE — ED Notes (Signed)
Pt A&oX4, ambulatory at d/c with steady gait, NAD 

## 2015-10-06 ENCOUNTER — Emergency Department (HOSPITAL_COMMUNITY)
Admission: EM | Admit: 2015-10-06 | Discharge: 2015-10-07 | Disposition: A | Discharge status: Home / Self Care | Payer: BLUE CROSS/BLUE SHIELD | Attending: Emergency Medicine | Admitting: Emergency Medicine

## 2015-10-06 ENCOUNTER — Encounter (HOSPITAL_COMMUNITY): Payer: Self-pay | Admitting: Emergency Medicine

## 2015-10-06 DIAGNOSIS — B353 Tinea pedis: Secondary | ICD-10-CM | POA: Diagnosis not present

## 2015-10-06 DIAGNOSIS — B356 Tinea cruris: Secondary | ICD-10-CM | POA: Insufficient documentation

## 2015-10-06 DIAGNOSIS — R21 Rash and other nonspecific skin eruption: Secondary | ICD-10-CM | POA: Diagnosis present

## 2015-10-06 DIAGNOSIS — F17200 Nicotine dependence, unspecified, uncomplicated: Secondary | ICD-10-CM | POA: Insufficient documentation

## 2015-10-06 MED ORDER — TERBINAFINE HCL 1 % EX CREA: 1.0000 "application " | TOPICAL_CREAM | Freq: Two times a day (BID) | CUTANEOUS | Status: AC

## 2015-10-06 NOTE — ED Provider Notes (Signed)
CSN: [id number]     Arrival date & time 10/06/15  2227 History  By signing my name below, I, Tyrone Johns, attest that this documentation has been prepared under the direction and in the presence of Tyrone Morn NP. Electronically Signed: Bethel Johns, ED Scribe. 10/06/2015 11:34 PM  Chief Complaint  Patient presents with  . Tinea Pedis    Patient is a 25 y.o. male presenting with rash. The history is provided by the patient. No language interpreter was used.  Rash Location:  Foot Foot rash location:  R toes and L toes Quality: itchiness and painful   Severity:  Moderate Onset quality:  Gradual Duration: several weeks. Timing:  Constant Progression:  Worsening Chronicity:  New Relieved by:  Nothing Worsened by:  Nothing tried Ineffective treatments:  None tried  Tyrone Johns is a 26 y.o. male who presents to the Emergency Department complaining of worsening itching and painfully cracking rash between the toes of the bilateral feet bilaterally (R>L)  with onset several weeks ago. Pt notes that his feet sweat frequently and he does not dry them completely after showering. He also notes a pruritic rash at the groin. He has tried nothing for his symptoms at home.   History reviewed. No pertinent past medical history. Past Surgical History  Procedure Laterality Date  . Appendectomy     No family history on file. Social History  Substance Use Topics  . Smoking status: Current Every Day Smoker  . Smokeless tobacco: None  . Alcohol Use: Yes    Review of Systems  Skin: Positive for rash.  All other systems reviewed and are negative.  Allergies  Review of patient's allergies indicates no known allergies.  Home Medications   Prior to Admission medications   Medication Sig Start Date End Date Taking? Authorizing Provider  HYDROcodone-acetaminophen (NORCO/VICODIN) 5-325 MG per tablet Take 1-2 tablets by mouth every 4 (four) hours as needed. Patient not taking:  Reported on 12/17/2014 03/06/14   Tyrone Speed, PA-C  HYDROcodone-acetaminophen (NORCO/VICODIN) 5-325 MG per tablet Take 2 tablets by mouth every 4 (four) hours as needed. Patient not taking: Reported on 12/17/2014 04/25/14   Tyrone Sheer, PA-C  ibuprofen (ADVIL,MOTRIN) 800 MG tablet Take 1 tablet (800 mg total) by mouth 3 (three) times daily. Patient not taking: Reported on 12/17/2014 04/25/14   Tyrone Sheer, PA-C   BP 133/94 mmHg  Pulse 98  Temp(Src) 97.9 F (36.6 C) (Oral)  Resp 18  Ht  (1.803 m)  Wt 239 lb 4 oz (108.523 kg)  BMI 33.38 kg/m2  SpO2 96% Physical Exam  Constitutional: He is oriented to person, place, and time. He appears well-developed and well-nourished. No distress.  HENT:  Head: Normocephalic and atraumatic.  Eyes: Conjunctivae and EOM are normal.  Neck: Neck supple. No tracheal deviation present.  Cardiovascular: Normal rate.   Pulmonary/Chest: Effort normal. No respiratory distress.  Musculoskeletal: Normal range of motion.  Neurological: He is alert and oriented to person, place, and time.  Skin: Skin is warm and dry. Rash noted.  Excoriation between the toes consistent with tinea   Psychiatric: He has a normal mood and affect. His behavior is normal.  Nursing note and vitals reviewed.   ED Course  Procedures (including critical care time) DIAGNOSTIC STUDIES: Oxygen Saturation is 96% on RA,  normal by my interpretation.    COORDINATION OF CARE: 11:28 PM Discussed treatment plan which includes discharge with antifungal medication with pt at bedside and pt agreed  to plan.  Labs Review Labs Reviewed - No data to display  Imaging Review No results found.    EKG Interpretation None      MDM   Final diagnoses:  Tinea pedis of both feet  Tinea cruris    Patient with tinea infection. Will treat with anti-fungal medication. Pt instructed to keep the area dry. Contact precautions given. No signs of secondary infection. Follow up  in 2-3  days. Return precautions discussed. Pt is safe for discharge at this time.  I personally performed the services described in this documentation, which was scribed in my presence. The recorded information has been reviewed and is accurate.    Tyrone Morn, NP 10/06/15 2354  Tyrone Bilis, MD 10/07/15 [phone]

## 2015-10-06 NOTE — Discharge Instructions (Signed)
Athlete's Foot Athlete's foot (tinea pedis) is a fungal infection of the skin on the feet. It often occurs on the skin between the toes or underneath the toes. It can also occur on the soles of the feet. Athlete's foot is more likely to occur in hot, humid weather. Not washing your feet or changing your socks often enough can contribute to athlete's foot. The infection can spread from person to person (contagious). CAUSES Athlete's foot is caused by a fungus. This fungus thrives in warm, moist places. Most people get athlete's foot by sharing shower stalls, towels, and wet floors with an infected person. People with weakened immune systems, including those with diabetes, may be more likely to get athlete's foot. SYMPTOMS   Itchy areas between the toes or on the soles of the feet.  White, flaky, or scaly areas between the toes or on the soles of the feet.  Tiny, intensely itchy blisters between the toes or on the soles of the feet.  Tiny cuts on the skin. These cuts can develop a bacterial infection.  Thick or discolored toenails. DIAGNOSIS  Your caregiver can usually tell what the problem is by doing a physical exam. Your caregiver may also take a skin sample from the rash area. The skin sample may be examined under a microscope, or it may be tested to see if fungus will grow in the sample. A sample may also be taken from your toenail for testing. TREATMENT  Over-the-counter and prescription medicines can be used to kill the fungus. These medicines are available as powders or creams. Your caregiver can suggest medicines for you. Fungal infections respond slowly to treatment. You may need to continue using your medicine for several weeks. PREVENTION   Do not share towels.  Wear sandals in wet areas, such as shared locker rooms and shared showers.  Keep your feet dry. Wear shoes that allow air to circulate. Wear cotton or wool socks. HOME CARE INSTRUCTIONS   Take medicines as directed by  your caregiver. Do not use steroid creams on athlete's foot.  Keep your feet clean and cool. Wash your feet daily and dry them thoroughly, especially between your toes.  Change your socks every day. Wear cotton or wool socks. In hot climates, you may need to change your socks 2 to 3 times per day.  Wear sandals or canvas tennis shoes with good air circulation.  If you have blisters, soak your feet in Burow's solution or Epsom salts for 20 to 30 minutes, 2 times a day to dry out the blisters. Make sure you dry your feet thoroughly afterward. SEEK MEDICAL CARE IF:   You have a fever.  You have swelling, soreness, warmth, or redness in your foot.  You are not getting better after 7 days of treatment.  You are not completely cured after 30 days.  You have any problems caused by your medicines. MAKE SURE YOU:   Understand these instructions.  Will watch your condition.  Will get help right away if you are not doing well or get worse.   This information is not intended to replace advice given to you by your health care provider. Make sure you discuss any questions you have with your health care provider.   Document Released: 07/29/2000 Document Revised: 10/24/2011 Document Reviewed: 02/02/2015 Elsevier Interactive Patient Education 2016 [employer]  Jock Itch Jock itch (tinea cruris) is a fungal infection of the skin in the groin area. It is sometimes called ringworm, even though it is  not caused by worms. It is caused by a fungus, which is a type of germ that thrives in dark, damp places. Jock itch causes a rash and itching in the groin and upper thigh area. It usually goes away in 2-3 weeks with treatment. °CAUSES °The fungus that causes jock itch may be spread by: °· Touching a fungus infection elsewhere on your body--such as athlete's foot--and then touching your groin area. °· Sharing towels or clothing with an infected person. °RISK FACTORS °Jock itch is most common in men and  adolescent boys. This condition is more likely to develop from: °· Being in hot, humid climates. °· Wearing tight-fitting clothing or wet bathing suits for long periods of time. °· Participating in sports. °· Being overweight. °· Having diabetes. °SYMPTOMS °Symptoms of jock itch may include: °· A red, pink, or brown rash in the groin area. The rash may spread to the thighs, anus, and buttocks. °· Dry and scaly skin on or around the rash. °· Itchiness. °DIAGNOSIS °Most often, a health care provider can make the diagnosis by looking at your rash. Sometimes, a scraping of the infected skin will be taken. This sample may be tested by looking at it under a microscope or by trying to grow the fungus from the sample (culture).  °TREATMENT °Treatment for this condition may include: °· Antifungal medicine to kill the fungus. This may be in various forms: °¨ Skin cream or ointment. °¨ Medicine taken by mouth. °· Skin cream or ointment to reduce the itching. °· Compresses or medicated powders to dry the infected skin. °HOME CARE INSTRUCTIONS °· Take medicines only as directed by your health care provider. Apply skin creams or ointments exactly as directed. °· Wear loose-fitting clothing. °¨ Men should wear cotton boxer shorts. °¨ Women should wear cotton underwear. °· Change your underwear every day to keep your groin dry. °· Avoid hot baths. °· Dry your groin area well after bathing. °¨ Use a separate towel to dry your groin area. This will help to prevent a spreading of the infection to other areas of your body. °· Do not scratch the affected area. °· Do not share towels with other people. °SEEK MEDICAL CARE IF: °· Your rash does not improve or it gets worse after 2 weeks of treatment. °· Your rash is spreading. °· Your rash returns after treatment is finished. °· You have a fever. °· You have redness, swelling, or pain in the area around your rash. °· You have fluid, blood, or pus coming from your rash. °· Your have your  rash for more than 4 weeks. °  °This information is not intended to replace advice given to you by your health care provider. Make sure you discuss any questions you have with your health care provider. °  °Document Released: 07/22/2002 Document Revised: 08/22/2014 Document Reviewed: 05/13/2014 °Elsevier Interactive Patient Education ©2016 Else[employer] Inc[employer] ° °

## 2015-10-06 NOTE — ED Notes (Signed)
Pt. reports moist itchy  skin / cracked between toes for several weeks at both feet worse at right side .

## 2015-10-07 ENCOUNTER — Encounter (HOSPITAL_COMMUNITY): Payer: Self-pay

## 2015-10-07 ENCOUNTER — Emergency Department (HOSPITAL_COMMUNITY)
Admission: EM | Admit: 2015-10-07 | Discharge: 2015-10-07 | Disposition: A | Discharge status: Home / Self Care | Payer: BLUE CROSS/BLUE SHIELD | Attending: Emergency Medicine | Admitting: Emergency Medicine

## 2015-10-07 DIAGNOSIS — F17200 Nicotine dependence, unspecified, uncomplicated: Secondary | ICD-10-CM | POA: Insufficient documentation

## 2015-10-07 DIAGNOSIS — Z79899 Other long term (current) drug therapy: Secondary | ICD-10-CM | POA: Insufficient documentation

## 2015-10-07 DIAGNOSIS — M79671 Pain in right foot: Secondary | ICD-10-CM | POA: Diagnosis present

## 2015-10-07 DIAGNOSIS — I891 Lymphangitis: Secondary | ICD-10-CM

## 2015-10-07 DIAGNOSIS — L03031 Cellulitis of right toe: Secondary | ICD-10-CM | POA: Insufficient documentation

## 2015-10-07 MED ORDER — SULFAMETHOXAZOLE-TRIMETHOPRIM 800-160 MG PO TABS: 1.0000 | ORAL_TABLET | Freq: Two times a day (BID) | ORAL | Status: AC

## 2015-10-07 NOTE — ED Provider Notes (Signed)
CSN: [id number]     Arrival date & time 10/07/15  0845 History   First MD Initiated Contact with Patient 10/07/15 [id number]     Chief Complaint  Patient presents with  . Foot Pain     (Consider location/radiation/quality/duration/timing/severity/associated sxs/prior Treatment) HPI Comments: The patient is a 25 year old male, he was seen last night for tinea pedis of his right foot, he was given terbinafine cream, he states that overnight he has now developed redness and swelling of the right small toe as well as a red streak going up the back of his foot. Symptoms are persistent, mild, pain with walking, no fevers. No meds given prior to arrival other than the prescription cream given last night.  Patient is a 25 y.o. male presenting with lower extremity pain. The history is provided by the patient.  Foot Pain    History reviewed. No pertinent past medical history. Past Surgical History  Procedure Laterality Date  . Appendectomy     No family history on file. Social History  Substance Use Topics  . Smoking status: Current Every Day Smoker  . Smokeless tobacco: None  . Alcohol Use: Yes    Review of Systems  Constitutional: Negative for fever.  Skin: Positive for color change.      Allergies  Review of patient's allergies indicates no known allergies.  Home Medications   Prior to Admission medications   Medication Sig Start Date End Date Taking? Authorizing Provider  HYDROcodone-acetaminophen (NORCO/VICODIN) 5-325 MG per tablet Take 1-2 tablets by mouth every 4 (four) hours as needed. Patient not taking: Reported on 12/17/2014 03/06/14   Kathrynn Speed, PA-C  HYDROcodone-acetaminophen (NORCO/VICODIN) 5-325 MG per tablet Take 2 tablets by mouth every 4 (four) hours as needed. Patient not taking: Reported on 12/17/2014 04/25/14   Riki Sheer, PA-C  ibuprofen (ADVIL,MOTRIN) 800 MG tablet Take 1 tablet (800 mg total) by mouth 3 (three) times daily. Patient not taking: Reported on  12/17/2014 04/25/14   Riki Sheer, PA-C  sulfamethoxazole-trimethoprim (BACTRIM DS,SEPTRA DS) 800-160 MG tablet Take 1 tablet by mouth 2 (two) times daily. 10/07/15 10/14/15  Eber Hong, MD  terbinafine (LAMISIL) 1 % cream Apply 1 application topically 2 (two) times daily. 10/06/15   [name], NP   BP 129/80 mmHg  Pulse 72  Temp(Src) 97.9 F (36.6 C) (Oral)  Resp 18  Ht  (1.803 m)  Wt 239 lb (108.41 kg)  BMI 33.35 kg/m2  SpO2 98% Physical Exam  Constitutional: He appears well-developed and well-nourished.  HENT:  Head: Normocephalic and atraumatic.  Eyes: Conjunctivae are normal. Right eye exhibits no discharge. Left eye exhibits no discharge.  Pulmonary/Chest: Effort normal. No respiratory distress.  Musculoskeletal: He exhibits tenderness.  Neurological: He is alert. Coordination normal.  Skin: Skin is warm and dry. Rash noted. He is not diaphoretic. No erythema.  Psychiatric: He has a normal mood and affect.  Nursing note and vitals reviewed.   ED Course  Procedures (including critical care time) Labs Review Labs Reviewed - No data to display  Imaging Review No results found. I have personally reviewed and evaluated these images and lab results as part of my medical decision-making.   EKG Interpretation None      MDM   Final diagnoses:  Lymphangitis  Cellulitis of toe, right    There is a small amount of streaking up the dorsum of the right foot from the small toe, I suspect this is a lymphangitis secondary to a cellulitis which  may be secondary to the break in the skin from the fungal infection. Vital signs are normal, patient is stable, prescription for Bactrim, patient has had his questions answered, he has expressed his understanding to the verbal discharge instructions, stable for discharge.  Meds given in ED:  Medications - No data to display  New Prescriptions   SULFAMETHOXAZOLE-TRIMETHOPRIM (BACTRIM DS,SEPTRA DS) 800-160 MG TABLET    Take 1  tablet by mouth 2 (two) times daily.        Eber Hong, MD 10/07/15 [phone]

## 2015-10-07 NOTE — Discharge Instructions (Signed)
Bactrim twice daily for 10 days Return if swelling / pain / fever or drainage worsens Repeat exam in 48 hours

## 2016-01-13 IMAGING — DX DG CHEST 2V
2 series · 2 of 2 positions shown · non-contrast
Comparison: None.

CLINICAL DATA: Palpitations and dyspnea

EXAM:
CHEST  2 VIEW

[chest pa]
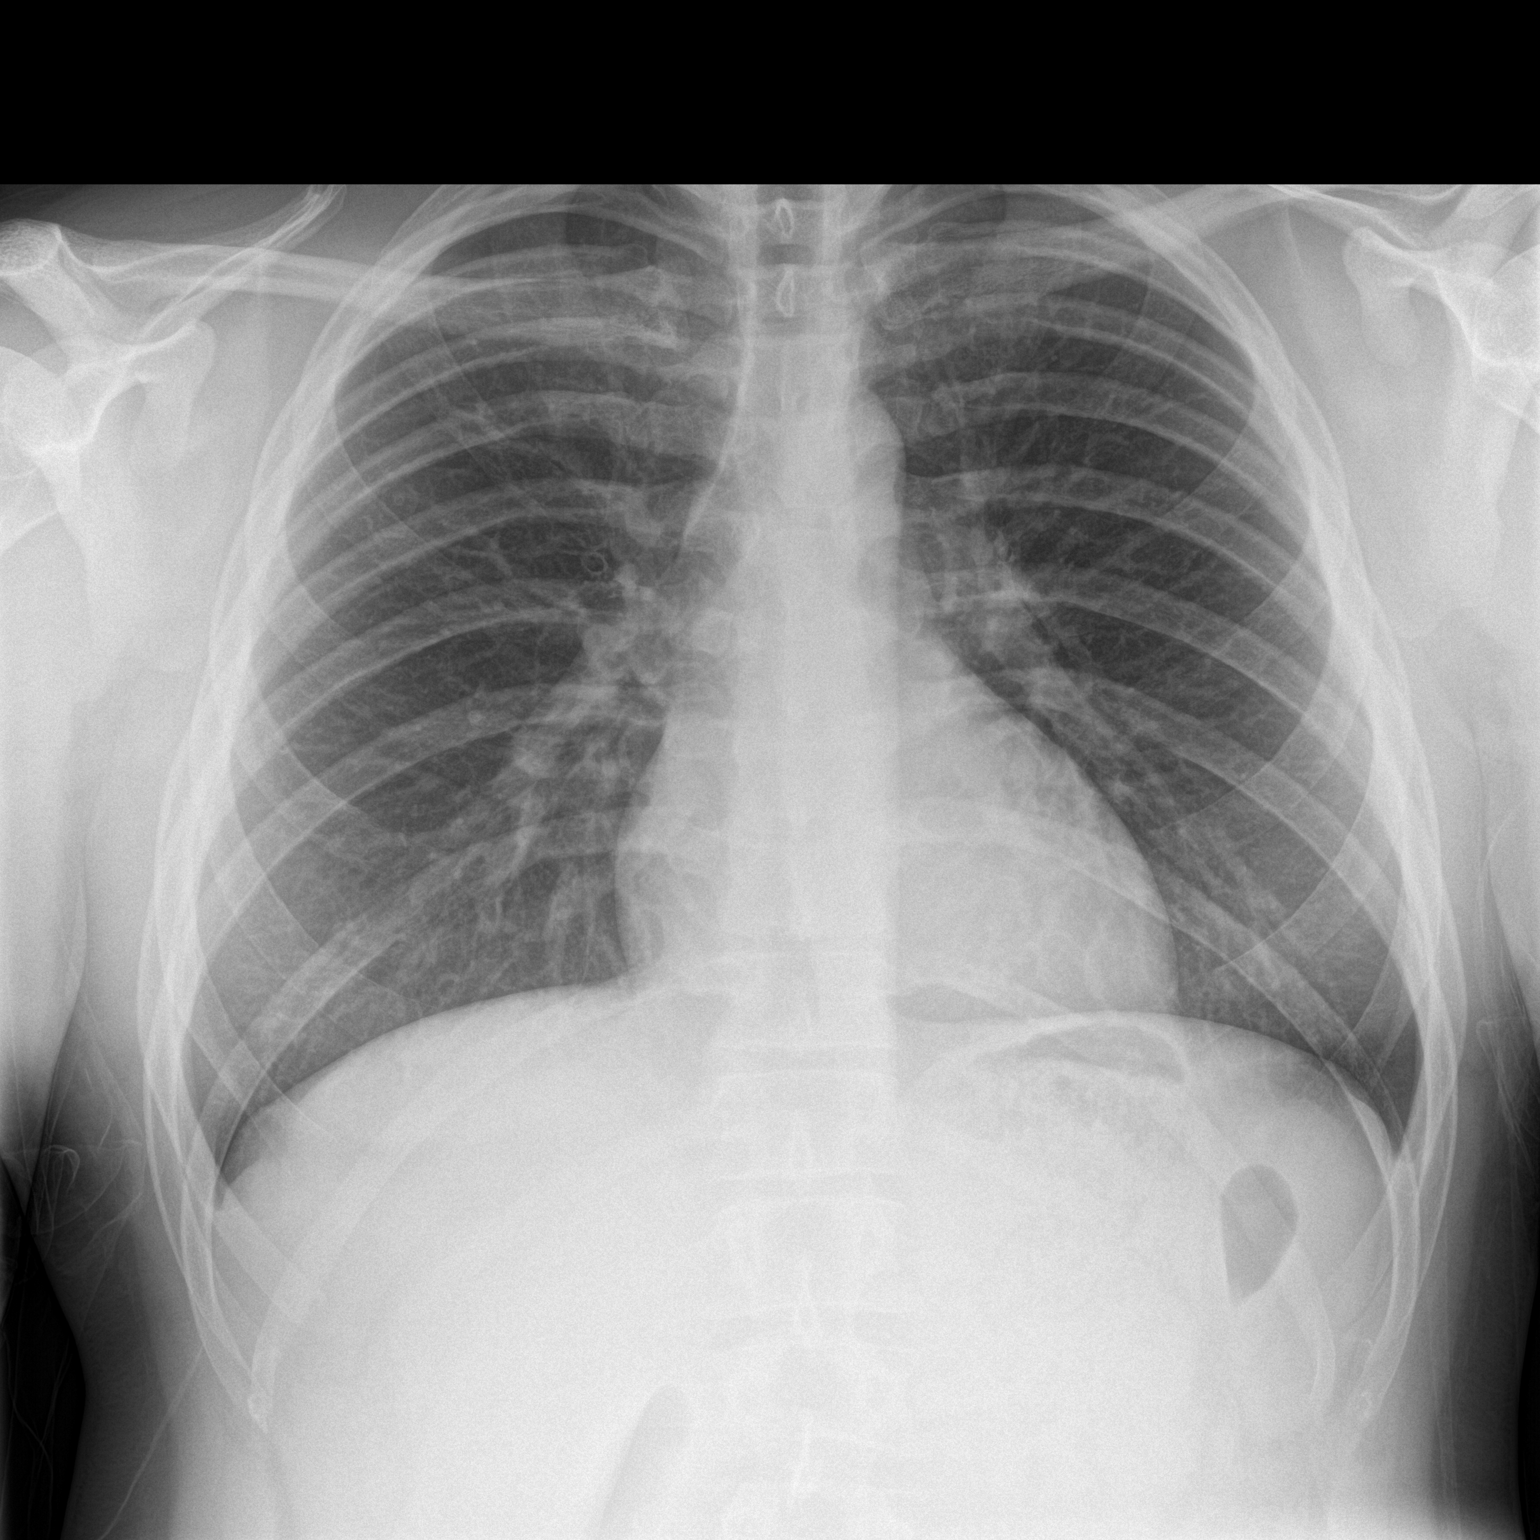

[chest lat]
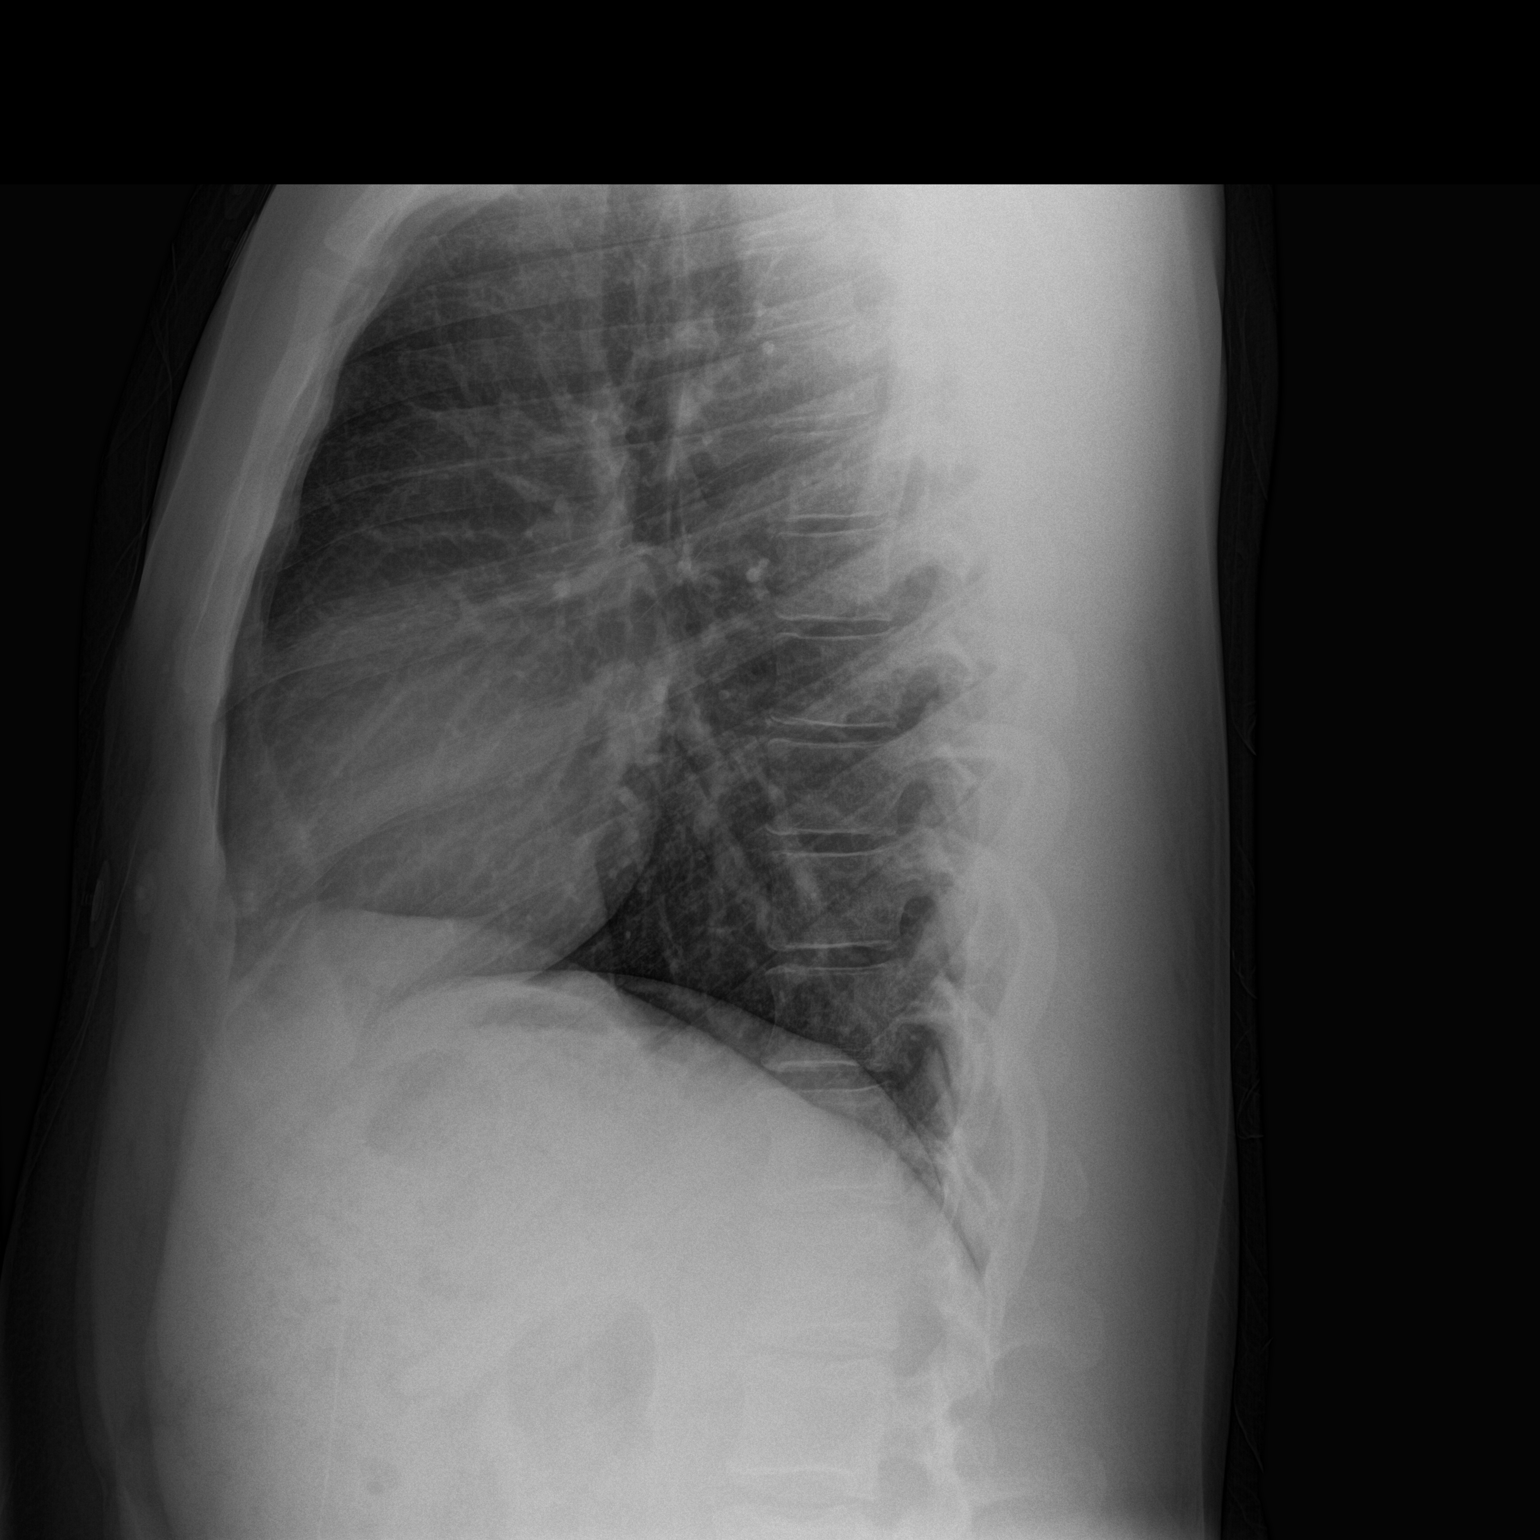

[2 of 2 positions shown; findings below may reference images not displayed]

FINDINGS: The heart size and mediastinal contours are within normal limits.
Both lungs are clear. The visualized skeletal structures are
unremarkable.
IMPRESSION: No active cardiopulmonary disease.

## 2020-10-07 ENCOUNTER — Telehealth: Payer: Self-pay

## 2020-10-07 NOTE — Telephone Encounter (Signed)
Patient called and stated he has found a hard lump under his right breast/nipple area, requests have this evaluated, does not have any insurance coverage.  Patient informed that [facility], only covers women that uninsured, telephone numbers to [facility] [facility] provided. Patient stated he would call.
